# Patient Record
Sex: Male | Born: 1980 | Race: Black or African American | Hispanic: No | Marital: Married | Smoking: Current every day smoker
Health system: Southern US, Community
[De-identification: ages and names within clinical notes are randomized; demographics above are authoritative.]

---

## 2004-09-30 ENCOUNTER — Emergency Department (HOSPITAL_COMMUNITY): Admission: EM | Admit: 2004-09-30 | Discharge: 2004-09-30 | Payer: Self-pay | Admitting: Emergency Medicine

## 2013-09-04 ENCOUNTER — Emergency Department (HOSPITAL_COMMUNITY)
Admission: EM | Admit: 2013-09-04 | Discharge: 2013-09-04 | Disposition: A | Discharge status: Home / Self Care | Payer: Self-pay | Attending: Emergency Medicine | Admitting: Emergency Medicine

## 2013-09-04 ENCOUNTER — Emergency Department (HOSPITAL_COMMUNITY): Payer: Self-pay

## 2013-09-04 ENCOUNTER — Encounter (HOSPITAL_COMMUNITY): Payer: Self-pay | Admitting: Emergency Medicine

## 2013-09-04 DIAGNOSIS — S62319A Displaced fracture of base of unspecified metacarpal bone, initial encounter for closed fracture: Secondary | ICD-10-CM | POA: Insufficient documentation

## 2013-09-04 DIAGNOSIS — Y9367 Activity, basketball: Secondary | ICD-10-CM | POA: Insufficient documentation

## 2013-09-04 DIAGNOSIS — S62306A Unspecified fracture of fifth metacarpal bone, right hand, initial encounter for closed fracture: Secondary | ICD-10-CM

## 2013-09-04 DIAGNOSIS — Y9239 Other specified sports and athletic area as the place of occurrence of the external cause: Secondary | ICD-10-CM | POA: Insufficient documentation

## 2013-09-04 DIAGNOSIS — W219XXA Striking against or struck by unspecified sports equipment, initial encounter: Secondary | ICD-10-CM | POA: Insufficient documentation

## 2013-09-04 DIAGNOSIS — Y92838 Other recreation area as the place of occurrence of the external cause: Secondary | ICD-10-CM

## 2013-09-04 DIAGNOSIS — F17200 Nicotine dependence, unspecified, uncomplicated: Secondary | ICD-10-CM | POA: Insufficient documentation

## 2013-09-04 MED ORDER — HYDROCODONE-ACETAMINOPHEN 5-325 MG PO TABS: 1.0000 | ORAL_TABLET | ORAL | Status: AC | PRN

## 2013-09-04 MED ORDER — IBUPROFEN 400 MG PO TABS
800.0000 mg | ORAL_TABLET | Freq: Once | ORAL | Status: AC
  Administered 2013-09-04: 800 mg via ORAL
  Filled 2013-09-04: qty 2

## 2013-09-04 NOTE — Progress Notes (Signed)
Orthopedic Tech Progress Note Patient Details:  Jason Moon [birth year] [id number] Ulna gutter splint applied to RUE; application tolerated well. Arm sling applied. Ortho Devices Type of Ortho Device: Ace wrap;Arm sling;Ulna gutter splint Ortho Device/Splint Location: RUE Ortho Device/Splint Interventions: Application   Asia R Thompson 09/04/2013, 2:09 PM

## 2013-09-04 NOTE — ED Notes (Signed)
Patient states he hurt his R wrist playing basketball on Saturday.   Patient states "I think it's fractured".   Patient states has not been using anything for pain at home.   Patient has been icing the area since time of injury.

## 2013-09-04 NOTE — Discharge Instructions (Signed)
Take Vicodin for severe pain only. No driving or operating heavy machinery while taking vicodin. This medication may cause drowsiness.  Boxer's Fracture You have a break (fracture) of the fifth metacarpal bone. This is commonly called a boxer's fracture. This is the bone in the hand where the little finger attaches. The fracture is in the end of that bone, closest to the little finger. It is usually caused when you hit an object with a clenched fist. Often, the knuckle is pushed down by the impact. Sometimes, the fracture rotates out of position. A boxer's fracture will usually heal within 6 weeks, if it is treated properly and protected from re-injury. Surgery is sometimes needed. A cast, splint, or bulky hand dressing may be used to protect and immobilize a boxer's fracture. Do not remove this device or dressing until your caregiver approves. Keep your hand elevated, and apply ice packs for 15-20 minutes every 2 hours, for the first 2 days. Elevation and ice help reduce swelling and relieve pain. See your caregiver, or an orthopedic specialist, for follow-up care within the next 10 days. This is to make sure your fracture is healing properly. Document Released: 03/23/2005 Document Revised: 06/15/2011 Document Reviewed: 09/10/2006 [facility] Patient Information 2014 [place], [place].  Hand Fracture, Metacarpals Fractures of metacarpals are breaks in the bones of the hand. They extend from the knuckles to the wrist. These bones can undergo many types of fractures. There are different ways of treating these fractures, all of which may be correct. TREATMENT  Hand fractures can be treated with:   Non-reduction - The fracture is casted without changing the positions of the fracture (bone pieces) involved. This fracture is usually left in a cast for 4 to 6 weeks or as your caregiver thinks necessary.  Closed reduction - The bones are moved back into position without surgery and then casted.  ORIF (open  reduction and internal fixation) - The fracture site is opened and the bone pieces are fixed into place with some type of hardware, such as screws, etc. They are then casted. Your caregiver will discuss the type of fracture you have and the treatment that should be best for that problem. If surgery is chosen, let your caregivers know about the following.  LET YOUR CAREGIVERS KNOW ABOUT:  Allergies.  Medications you are taking, including herbs, eye drops, over the counter medications, and creams.  Use of steroids (by mouth or creams).  Previous problems with anesthetics or novocaine.  Possibility of pregnancy.  History of blood clots (thrombophlebitis).  History of bleeding or blood problems.  Previous surgeries.  Other health problems. AFTER THE PROCEDURE After surgery, you will be taken to the recovery area where a nurse will watch and check your progress. Once you are awake, stable, and taking fluids well, barring other problems, you'll be allowed to go home. Once home, an ice pack applied to your operative site may help with pain and keep the swelling down. HOME CARE INSTRUCTIONS   Follow your caregiver's instructions as to activities, exercises, physical therapy, and driving a car.  Daily exercise is helpful for keeping range of motion and strength. Exercise as instructed.  To lessen swelling, keep the injured hand elevated above the level of your heart as much as possible.  Apply ice to the injury for 15-20 minutes each hour while awake for the first 2 days. Put the ice in a plastic bag and place a thin towel between the bag of ice and your cast.  Move the  fingers of your casted hand several times a day.  If a plaster or fiberglass cast was applied:  Do not try to scratch the skin under the cast using a sharp or pointed object.  Check the skin around the cast every day. You may put lotion on red or sore areas.  Keep your cast dry. Your cast can be protected during  bathing with a plastic bag. Do not put your cast into the water.  If a plaster splint was applied:  Wear your splint for as long as directed by your caregiver or until seen again.  Do not get your splint wet. Protect it during bathing with a plastic bag.  You may loosen the elastic bandage around the splint if your fingers start to get numb, tingle, get cold or turn blue.  Do not put pressure on your cast or splint; this may cause it to break. Especially, do not lean plaster casts on hard surfaces for 24 hours after application.  Take medications as directed by your caregiver.  Only take over-the-counter or prescription medicines for pain, discomfort, or fever as directed by your caregiver.  Follow-up as provided by your caregiver. This is very important in order to avoid permanent injury or disability and chronic pain. SEEK MEDICAL CARE IF:   Increased bleeding (more than a small spot) from beneath your cast or splint if there is beneath the cast as with an open reduction.  Redness, swelling, or increasing pain in the wound or from beneath your cast or splint.  Pus coming from wound or from beneath your cast or splint.  An unexplained oral temperature above 102 F (38.9 C) develops, or as your caregiver suggests.  A foul smell coming from the wound or dressing or from beneath your cast or splint.  You have a problem moving any of your fingers. SEEK IMMEDIATE MEDICAL CARE IF:   You develop a rash  You have difficulty breathing  You have any allergy problems If you do not have a window in your cast for observing the wound, a discharge or minor bleeding may show up as a stain on the outside of your cast. Report these findings to your caregiver. MAKE SURE YOU:   Understand these instructions.  Will watch your condition.  Will get help right away if you are not doing well or get worse. Document Released: 03/23/2005 Document Revised: 06/15/2011 Document Reviewed:  11/10/2007 [facility] Patient Information 2014 [place], [place].

## 2013-09-04 NOTE — ED Notes (Signed)
Called ortho to place ulnar gutter splint.

## 2013-09-04 NOTE — ED Provider Notes (Signed)
Medical screening examination/treatment/procedure(s) were performed by non-physician practitioner and as supervising physician I was immediately available for consultation/collaboration.   EKG Interpretation None       Doug Sou, MD 09/04/13 1707

## 2013-09-04 NOTE — ED Provider Notes (Signed)
CSN: [id number]     Arrival date & time 09/04/13  1229 History  This chart was scribed for non-physician practitioner, Johnnette Gourd, PA-C working with Doug Sou, MD by Greggory Stallion, ED scribe. This patient was seen in room TR05C/TR05C and the patient's care was started at 12:54 PM.   Chief Complaint  Patient presents with  . Wrist Pain   The history is provided by the patient. No language interpreter was used.   HPI Comments: Jason Moon is a 33 y.o. male who presents to the Emergency Department complaining of right wrist injury that occurred 2 days ago. Pt was playing a basketball and bent his wrist forward while trying to get the ball. He has sudden onset, worsening right wrist pain with associated swelling. Movements worsen the pain. He has used ice with no relief.   History reviewed. No pertinent past medical history. History reviewed. No pertinent past surgical history. No family history on file. History  Substance Use Topics  . Smoking status: Current Every Day Smoker -- 0.50 packs/day    Types: Cigarettes  . Smokeless tobacco: Not on file  . Alcohol Use: Yes    Review of Systems  Musculoskeletal: Positive for arthralgias and joint swelling.  All other systems reviewed and are negative.  Allergies  Review of patient's allergies indicates no known allergies.  Home Medications   Prior to Admission medications   Medication Sig Start Date End Date Taking? Authorizing Provider  HYDROcodone-acetaminophen (NORCO/VICODIN) 5-325 MG per tablet Take 1-2 tablets by mouth every 4 (four) hours as needed. 09/04/13   Trevor Mace, PA-C   BP 113/76  Pulse 85  Temp(Src) 98.4 F (36.9 C) (Oral)  Resp 18  Ht 5\' 6"  (1.676 m)  Wt 156 lb (70.761 kg)  BMI 25.19 kg/m2  SpO2 98%  Physical Exam  Nursing note and vitals reviewed. Constitutional: He is oriented to person, place, and time. He appears well-developed and well-nourished. No distress.  HENT:  Head: Normocephalic and  atraumatic.  Eyes: Conjunctivae and EOM are normal.  Neck: Normal range of motion. Neck supple.  Cardiovascular: Normal rate, regular rhythm and normal heart sounds.   Pulmonary/Chest: Effort normal and breath sounds normal.  Musculoskeletal: Normal range of motion. He exhibits no edema.  Tender to palpation over second, third and fourth metacarpals with mild swelling. Full wrist ROM, pain with flexion. Full hand ROM. No deformity. Capillary refill less than 3 seconds. 2+ radial pulse.   Neurological: He is alert and oriented to person, place, and time.  Skin: Skin is warm and dry.  Psychiatric: He has a normal mood and affect. His behavior is normal.    ED Course  Procedures (including critical care time)  DIAGNOSTIC STUDIES: Oxygen Saturation is 98% on RA, normal by my interpretation.    COORDINATION OF CARE: 12:55 PM-Discussed treatment plan which includes xray with pt at bedside and pt agreed to plan.   Labs Review Labs Reviewed - No data to display  Imaging Review Dg Wrist Complete Right  09/04/2013   CLINICAL DATA:  Pain post trauma  EXAM: RIGHT WRIST - COMPLETE 3+ VIEW  COMPARISON:  None.  FINDINGS: Frontal, oblique, lateral, and ulnar deviation scaphoid images were obtained. There is a comminuted fracture of the distal fifth metacarpal with volar angulation distally. There is no other fracture. No dislocation. Joint spaces appear intact.  IMPRESSION: Comminuted fracture distal fifth metacarpal with volar angulation distally.   Electronically Signed   By: Bretta Bang M.D.  On: 09/04/2013 13:28   Dg Hand Complete Right  09/04/2013   CLINICAL DATA:  Twisting injury with pain.  EXAM: RIGHT HAND - COMPLETE 3+ VIEW  COMPARISON:  None.  FINDINGS: There is a fracture of the fifth metacarpal neck with minimal apex dorsal angulation. No additional evidence of acute fracture.  IMPRESSION: Fifth metacarpal neck fracture.   Electronically Signed   By: Leanna BattlesMelinda  Blietz M.D.   On:  09/04/2013 13:23     EKG Interpretation None      MDM   Final diagnoses:  Fracture of fifth metacarpal bone of right hand    Patient presenting with right hand injury. Well appearing and in no apparent distress. Afebrile, vital signs stable. Neurovascularly intact. X-ray showing comminuted fracture distal fifth metacarpal with volar angulation distally. Ulnar gutter splint applied, followup with orthopedics. Stable for discharge. Return precautions given. Patient states understanding of treatment care plan and is agreeable.  I personally performed the services described in this documentation, which was scribed in my presence. The recorded information has been reviewed and is accurate.  Trevor MaceRobyn M Albert, PA-C 09/04/13 1416

## 2014-11-05 IMAGING — CR DG HAND COMPLETE 3+V*R*
3 series · 3 of 3 positions shown · non-contrast
Comparison: None.

CLINICAL DATA: Twisting injury with pain.

EXAM:
RIGHT HAND - COMPLETE 3+ VIEW

[x hand pa right]
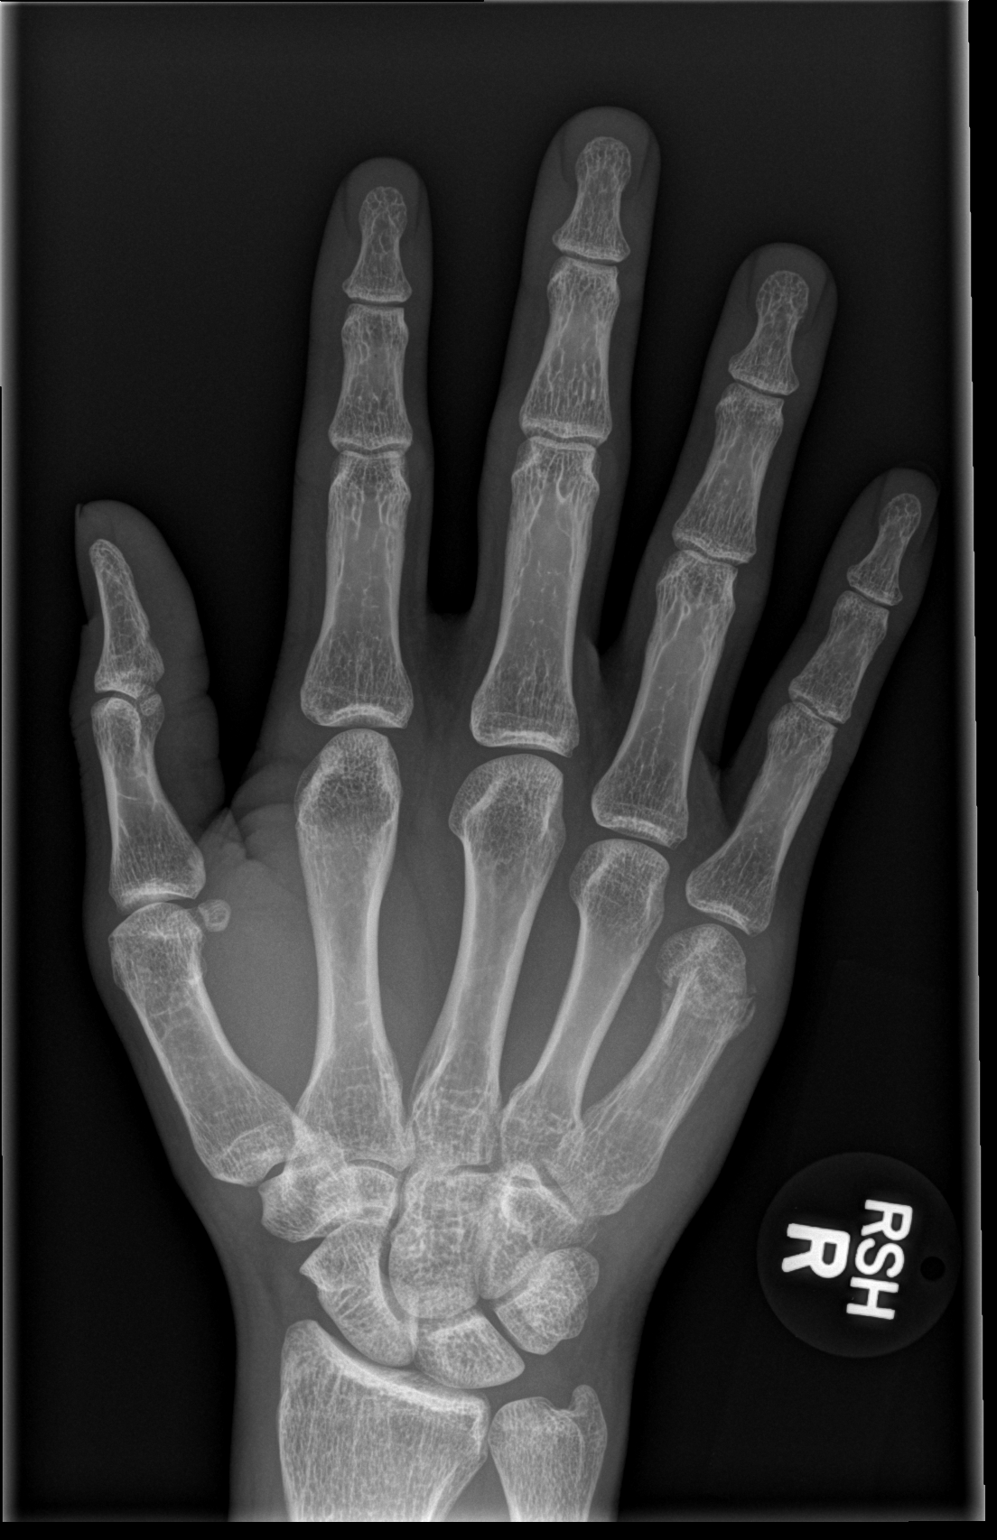

[x hand obl right]
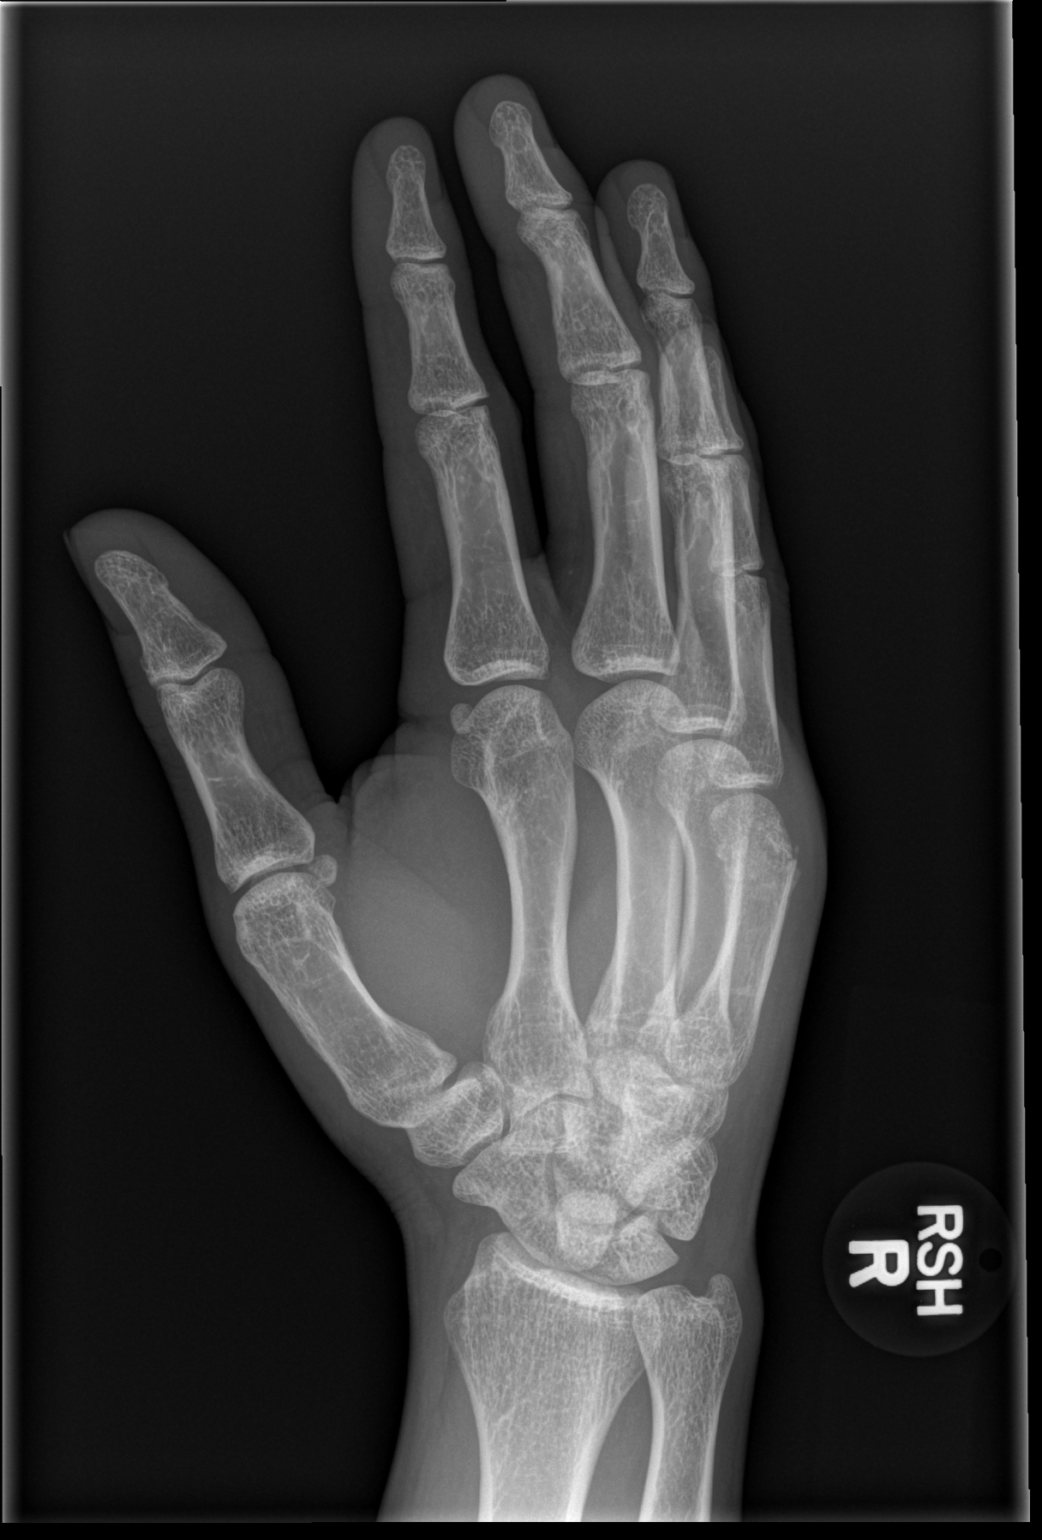

[x hand lat right]
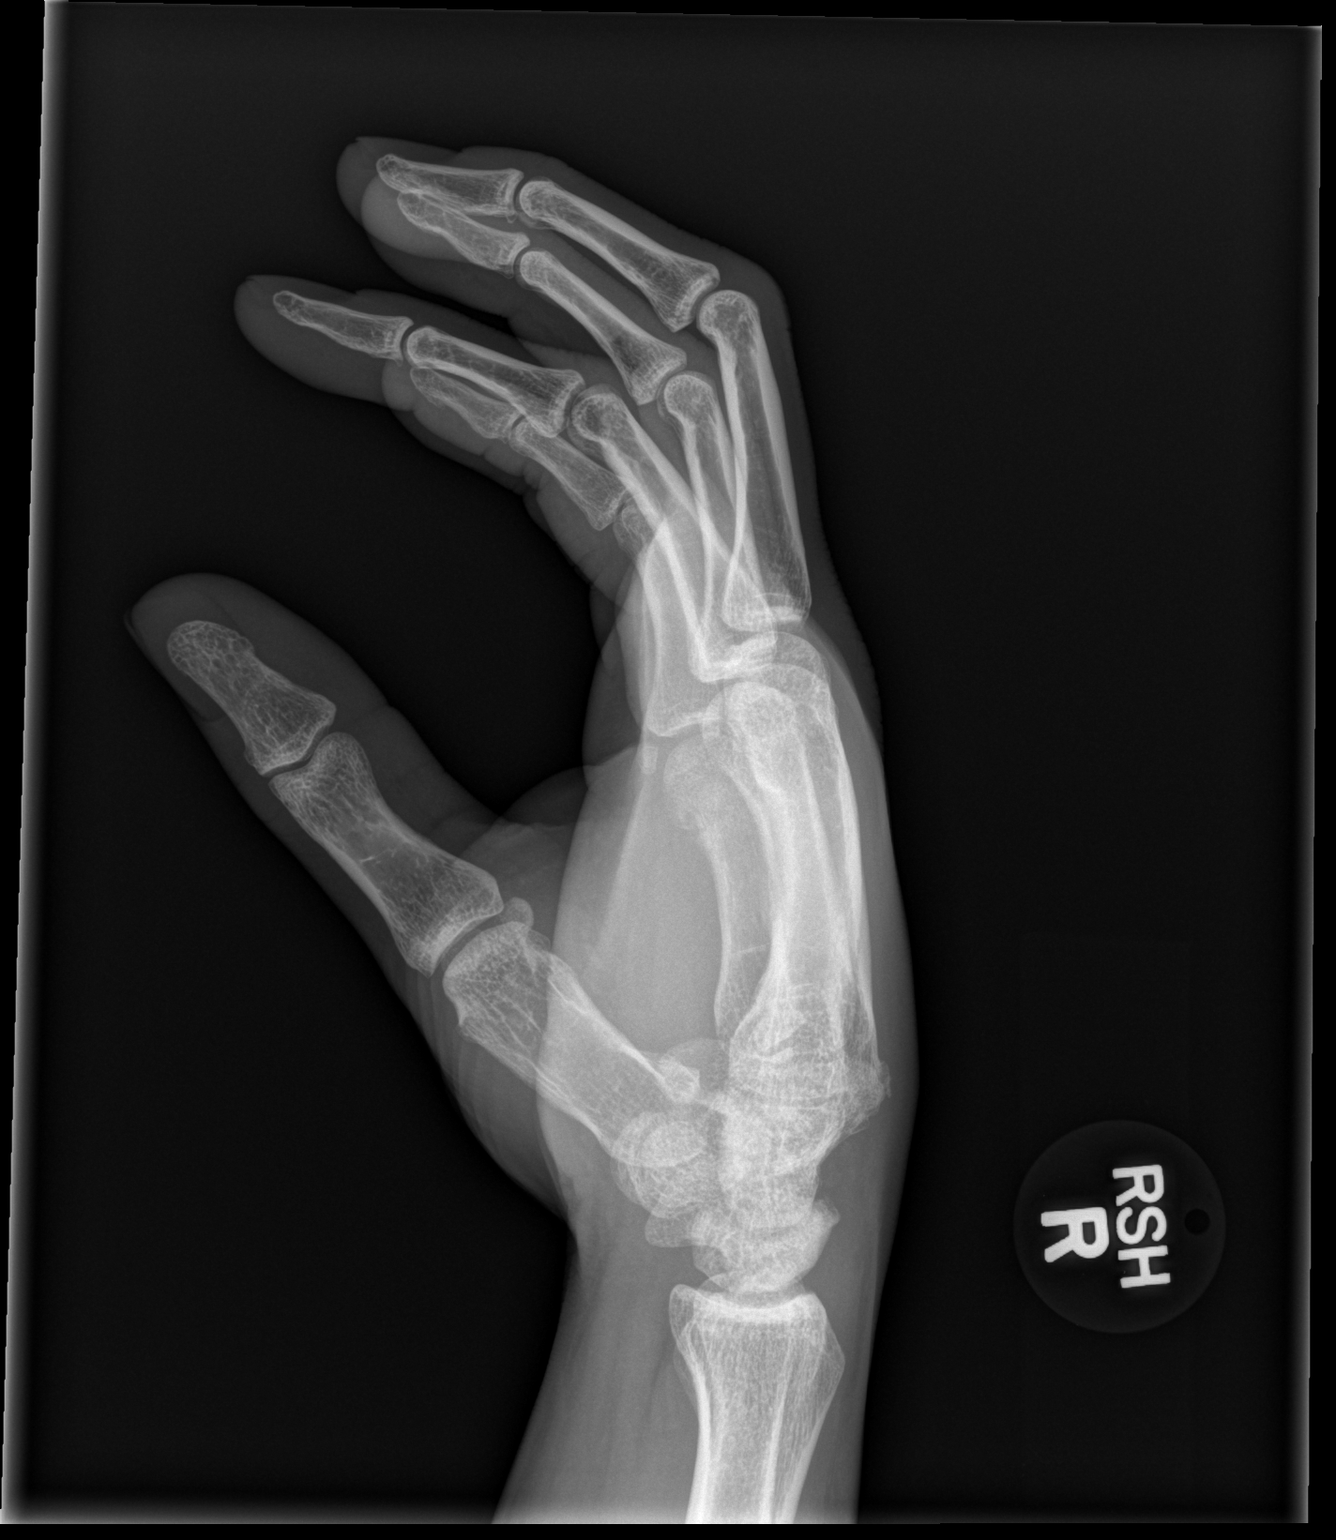

[3 of 3 positions shown; findings below may reference images not displayed]

FINDINGS: There is a fracture of the fifth metacarpal neck with minimal apex
dorsal angulation. No additional evidence of acute fracture.
IMPRESSION: Fifth metacarpal neck fracture.

## 2015-06-23 ENCOUNTER — Emergency Department (HOSPITAL_COMMUNITY)
Admission: EM | Admit: 2015-06-23 | Discharge: 2015-06-23 | Disposition: A | Discharge status: Home / Self Care | Payer: Self-pay | Attending: Emergency Medicine | Admitting: Emergency Medicine

## 2015-06-23 ENCOUNTER — Encounter (HOSPITAL_COMMUNITY): Payer: Self-pay | Admitting: *Deleted

## 2015-06-23 DIAGNOSIS — K0889 Other specified disorders of teeth and supporting structures: Secondary | ICD-10-CM | POA: Insufficient documentation

## 2015-06-23 DIAGNOSIS — F17210 Nicotine dependence, cigarettes, uncomplicated: Secondary | ICD-10-CM | POA: Insufficient documentation

## 2015-06-23 DIAGNOSIS — K029 Dental caries, unspecified: Secondary | ICD-10-CM

## 2015-06-23 MED ORDER — NAPROXEN 500 MG PO TABS: 500.0000 mg | ORAL_TABLET | Freq: Two times a day (BID) | ORAL | Status: AC

## 2015-06-23 MED ORDER — ACETAMINOPHEN 325 MG PO TABS
650.0000 mg | ORAL_TABLET | Freq: Once | ORAL | Status: AC
  Administered 2015-06-23: 650 mg via ORAL
  Filled 2015-06-23: qty 2

## 2015-06-23 MED ORDER — AMOXICILLIN-POT CLAVULANATE 875-125 MG PO TABS: 1.0000 | ORAL_TABLET | Freq: Two times a day (BID) | ORAL | Status: AC

## 2015-06-23 MED ORDER — KETOROLAC TROMETHAMINE 30 MG/ML IJ SOLN
30.0000 mg | Freq: Once | INTRAMUSCULAR | Status: AC
  Administered 2015-06-23: 30 mg via INTRAMUSCULAR
  Filled 2015-06-23: qty 1

## 2015-06-23 NOTE — ED Notes (Signed)
Declined W/C at D/C and was escorted to lobby by RN. 

## 2015-06-23 NOTE — ED Notes (Signed)
Pt reports dental pain started on Thursday. Pt reports 2 chipped teeth.

## 2015-06-23 NOTE — Discharge Instructions (Signed)
Community Resource Guide Dental °The [employer]’s “211” is a great source of information about community services available.  Access by dialing 2-1-1 from anywhere in [place], or by website -  www.nc211.org.  ° °Other Local Resources (Updated 04/2015) ° °Dental  Care °  °Services ° °  °Phone Number and Address  °Cost  °[facility] For children 0 - 33 years of age:  °• Cleaning °• Tooth brushing/flossing instruction °• Sealants, fillings, crowns °• Extractions °• Emergency treatment  [phone] °[address] °[place], [place] [address] Charges based on family income.  Medicaid and some insurance plans accepted.   °  °[facility] [place] • Cleaning °• Sealants, fillings, crowns °• Extractions °• Emergency treatment [phone] °[address] °[place], [place] ° Pregnant women 18 years of age or older with a Medicaid card  °[facility] [place] • Cleaning °• Sealants, fillings, crowns °• Extractions °• Emergency treatment [phone] °[address] °[place], [place] Pregnant women 18 years of age or older with a Medicaid card  °[facility] For children 0 - 33 years of age:  °• Cleaning °• Tooth brushing/flossing instruction °• Sealants, fillings, crowns °• Extractions °• Emergency treatment °Limited orthodontic services for patients with Medicaid [phone] °[address] °, [place] [address] Medicaid and [place] Health Choice cover for children up to age 33 and pregnant women.  Parents of children up to age 33 without Medicaid pay a reduced fee at time of service.  °[facility] [place] For children 0 - 33 years of age:  °• Cleaning °• Tooth brushing/flossing instruction °• Sealants, fillings, crowns °• Extractions °• Emergency treatment °Limited orthodontic services for patients with Medicaid  [phone] °[address] °[place], [place].  Medicaid and [place] Health Choice cover for children up to age 33 and pregnant women.  Parents of children up to age 33 without Medicaid pay a reduced fee.  °[facility] • Cleaning °• Sealants, fillings, crowns °• Extractions ° °Hours: Tuesdays and Thursdays, 4:15 - 8 pm [phone] °[address] °[place], [place] [address] Services free of charge to [place] residents ages 18-64 who do not have health insurance, Medicare, Medicaid, or VA benefits and fall within federal poverty guidelines  °[facility] ° ° ° Provides dental care in addition to primary medical care, nutritional counseling, and pharmacy: °• Cleaning °• Sealants, fillings, crowns °• Extractions ° ° ° ° ° ° ° ° ° ° ° ° ° ° ° ° ° [phone] °[facility], [address] °[place], [place] ° °[phone] °[facility], [address] [place], [place] ° °[phone] °[facility] °[place], [place] ° °[phone] °[facility], [address] °[place], [place] ° °[phone] °[facility] °[address] °[place], [place] Accepts Medicaid, Medicare, most insurance.  Also provides services available to all with fees adjusted based on ability to pay.    °[facility] • Cleaning °• Tooth brushing/flossing instruction °• Sealants, fillings, crowns °• Extractions °• Emergency treatment °Hours: Tuesdays, Thursdays, and Fridays from 8 am to 5 pm by appointment only. [phone] °[address] °[place],  [address] [place] residents with Medicaid (depending on eligibility) and children with  Health Choice - call for more information.  °  [facility] • Extractions only ° °Hours: 2nd and 4th Thursday of each month from 6:30 am - 9 am.   [phone] ext. [phone] °[address] °[place], [place] [address] Ages 18 and older only.  Patients are seen on a first come, first served basis.  °[facility] • Cleanings °• Fillings °• Extractions °• Orthodontics °• Endodontics °• Implants/Crowns/Bridges °• Complete and partial dentures [phone] °[place], [place] Patients must complete an application for services.  There is often a waiting list.   ° °

## 2015-06-23 NOTE — ED Provider Notes (Signed)
CSN: [id number]     Arrival date & time 06/23/15  0750 History   First MD Initiated Contact with Patient 06/23/15 [phone]     Chief Complaint  Patient presents with  . Dental Pain    HPI   Jason Moon is an 35 y.o. male with no significant PMH who presents to the ED for evaluation of dental pain. He states he has had intermittent issues with dental pain since 02/2015. However, for the past three days has noticed pain significantly increased from baseline along his entire lower left quadrant. States he knows he has a couple teeth that need root canals/extraction but he has not been to a dentist to have the work done. He states he also has two chipped teeth on his right lower quadrant. Denies drainage or bleeding. Denies fever, chills, trismus, dysphagia. He has tried tylenol with no relief of pain.  History reviewed. No pertinent past medical history. History reviewed. No pertinent past surgical history. History reviewed. No pertinent family history. Social History  Substance Use Topics  . Smoking status: Current Every Day Smoker -- 0.50 packs/day    Types: Cigarettes  . Smokeless tobacco: None  . Alcohol Use: Yes    Review of Systems  All other systems reviewed and are negative.     Allergies  Review of patient's allergies indicates no known allergies.  Home Medications   Prior to Admission medications   Medication Sig Start Date End Date Taking? Authorizing Provider  HYDROcodone-acetaminophen (NORCO/VICODIN) 5-325 MG per tablet Take 1-2 tablets by mouth every 4 (four) hours as needed. 09/04/13   Robyn M Hess, PA-C   BP 138/66 mmHg  Pulse 72  Temp(Src) 98.7 F (37.1 C) (Oral)  Resp 18  Ht  (1.702 m)  Wt 70.308 kg  BMI 24.27 kg/m2  SpO2 98% Physical Exam  Constitutional: He is oriented to person, place, and time. No distress.  HENT:  Head: Atraumatic.  Right Ear: External ear normal.  Left Ear: External ear normal.  Nose: Nose normal.  Generally poor dentition.  LLQ with much crowding, no grossly visible abscess or broken teeth. Jason Moon has a few grossly carious teeth, again with no abscess visible. Uvula midline  Left mandibular area diffusely ttp with no edema or erythema  No cervical LAD. No submental or submandibular swelling. No trismus.   Eyes: Conjunctivae are normal. No scleral icterus.  Neck: Normal range of motion. Neck supple.  Cardiovascular: Normal rate and regular rhythm.   Pulmonary/Chest: Effort normal. No respiratory distress. He exhibits no tenderness.  Abdominal: Soft. He exhibits no distension. There is no tenderness.  Neurological: He is alert and oriented to person, place, and time.  Skin: Skin is warm and dry. He is not diaphoretic.  Psychiatric: He has a normal mood and affect. His behavior is normal.  Nursing note and vitals reviewed.   ED Course  Procedures (including critical care time) Labs Review Labs Reviewed - No data to display  Imaging Review No results found. I have personally reviewed and evaluated these images and lab results as part of my medical decision-making.   EKG Interpretation None      MDM   Final diagnoses:  Pain due to dental caries    I discussed with pt that ultimately he will need to see a dentist for definitive treatment as he likely needs several fillings, root canals, and/or extractions. Discussed with pt that I will start him on a course of antibiotics and give rx for naproxen for  pain. Pt declining toradol here. Dental resource guide given as well. At this time pt is afebrile, and has no tachypnea or increased WOB. He is nontoxic appearing. Stable for discharge. He is in agreement with plan. ER return precautions given.    Carlene CoriaSerena Y Bhavin Monjaraz, PA-C 06/23/15 1035  Glynn OctaveStephen Rancour, MD 06/23/15 [phone]

## 2020-12-10 ENCOUNTER — Encounter (HOSPITAL_COMMUNITY): Payer: Self-pay | Admitting: Emergency Medicine

## 2020-12-10 ENCOUNTER — Emergency Department (HOSPITAL_COMMUNITY)
Admission: EM | Admit: 2020-12-10 | Discharge: 2020-12-10 | Disposition: A | Discharge status: Home / Self Care | Payer: Self-pay | Attending: Emergency Medicine | Admitting: Emergency Medicine

## 2020-12-10 DIAGNOSIS — F17210 Nicotine dependence, cigarettes, uncomplicated: Secondary | ICD-10-CM | POA: Insufficient documentation

## 2020-12-10 DIAGNOSIS — N5089 Other specified disorders of the male genital organs: Secondary | ICD-10-CM | POA: Insufficient documentation

## 2020-12-10 LAB — CBC WITH DIFFERENTIAL/PLATELET
Abs Immature Granulocytes: 0 10*3/uL (ref 0.00–0.07)
Basophils Absolute: 0 10*3/uL (ref 0.0–0.1)
Basophils Relative: 0 %
Eosinophils Absolute: 0.2 10*3/uL (ref 0.0–0.5)
Eosinophils Relative: 2 %
HCT: 45.3 % (ref 39.0–52.0)
Hemoglobin: 15.5 g/dL (ref 13.0–17.0)
Lymphocytes Relative: 30 %
Lymphs Abs: 2.5 10*3/uL (ref 0.7–4.0)
MCH: 33.4 pg (ref 26.0–34.0)
MCHC: 34.2 g/dL (ref 30.0–36.0)
MCV: 97.6 fL (ref 80.0–100.0)
Monocytes Absolute: 0.7 10*3/uL (ref 0.1–1.0)
Monocytes Relative: 9 %
Neutro Abs: 4.8 10*3/uL (ref 1.7–7.7)
Neutrophils Relative %: 59 %
Platelets: 137 10*3/uL — ABNORMAL LOW (ref 150–400)
RBC: 4.64 MIL/uL (ref 4.22–5.81)
RDW: 11.8 % (ref 11.5–15.5)
WBC: 8.2 10*3/uL (ref 4.0–10.5)
nRBC: 0 % (ref 0.0–0.2)
nRBC: 0 /100 WBC

## 2020-12-10 LAB — URINALYSIS, ROUTINE W REFLEX MICROSCOPIC
Glucose, UA: NEGATIVE mg/dL
Hgb urine dipstick: NEGATIVE
Ketones, ur: NEGATIVE mg/dL
Leukocytes,Ua: NEGATIVE
Nitrite: NEGATIVE
Protein, ur: NEGATIVE mg/dL
Specific Gravity, Urine: >1.03 — ABNORMAL HIGH (ref 1.005–1.030)
pH: 5.5 (ref 5.0–8.0)

## 2020-12-10 LAB — BASIC METABOLIC PANEL
Anion gap: 8 (ref 5–15)
BUN: 10 mg/dL (ref 6–20)
CO2: 27 mmol/L (ref 22–32)
Calcium: 9 mg/dL (ref 8.9–10.3)
Chloride: 102 mmol/L (ref 98–111)
Creatinine, Ser: 1.16 mg/dL (ref 0.61–1.24)
GFR, Estimated: >60 mL/min (ref >60)
Glucose, Bld: 108 mg/dL — ABNORMAL HIGH (ref 70–99)
Potassium: 4.1 mmol/L (ref 3.5–5.1)
Sodium: 137 mmol/L (ref 135–145)

## 2020-12-10 LAB — HIV ANTIBODY (ROUTINE TESTING W REFLEX): HIV Screen 4th Generation wRfx: NONREACTIVE

## 2020-12-10 NOTE — ED Provider Notes (Signed)
[facility] EMERGENCY DEPARTMENT Provider Note   CSN: [id number] Arrival date & time: 12/10/20  1159     History Chief Complaint  Patient presents with   Groin Pain    Jason Moon is a 40 y.o. male who presents to the ED today with complaint of rash to groin area for the past few days.  Patient reports he first noticed a genital sore Thursday 09/01.  Did not think much of it and had unprotected intercourse with a male the next day.  Patient states that since that time he has noticed some swelling to his right groin area as well as spreading of the sores.  He denies any pain to them.  He states they are mildly itchy.  He denies any penile discharge or testicular pain.  He is adamant that he only has intercourse with females.  He does mention that he had intercourse with a male about 1 to 2 weeks ago that had similar sores however he did not think anything of it at that time.  He denies any fevers, chills, headache, URI-like symptoms prior to noticing a rash.  The history is provided by the patient and medical records.      History reviewed. No pertinent past medical history.  There are no problems to display for this patient.   No past surgical history on file.     No family history on file.  Social History   Tobacco Use   Smoking status: Every Day    Packs/day: 0.50    Types: Cigarettes  Substance Use Topics   Alcohol use: Yes   Drug use: No    Home Medications Prior to Admission medications   Medication Sig Start Date End Date Taking? Authorizing Provider  amoxicillin-clavulanate (AUGMENTIN) 875-125 MG tablet Take 1 tablet by mouth every 12 (twelve) hours. Patient not taking: Reported on 12/10/2020 06/23/15   Eliseo Squires, PA-C  HYDROcodone-acetaminophen (NORCO/VICODIN) 5-325 MG per tablet Take 1-2 tablets by mouth every 4 (four) hours as needed. Patient not taking: Reported on 12/10/2020 09/04/13   Kathrynn Speed, PA-C  naproxen (NAPROSYN) 500  MG tablet Take 1 tablet (500 mg total) by mouth 2 (two) times daily. Patient not taking: Reported on 12/10/2020 06/23/15   Eliseo Squires, PA-C    Allergies    Patient has no known allergies.  Review of Systems   Review of Systems  Constitutional:  Negative for chills and fever.  Genitourinary:  Positive for genital sores. Negative for penile discharge, penile pain, scrotal swelling and testicular pain.  All other systems reviewed and are negative.  Physical Exam Updated Vital Signs BP 113/88   Pulse 77   Temp 98.8 F (37.1 C) (Oral)   Resp 16   SpO2 99%   Physical Exam Vitals and nursing note reviewed.  Constitutional:      Appearance: He is not ill-appearing.  HENT:     Head: Normocephalic and atraumatic.  Eyes:     Conjunctiva/sclera: Conjunctivae normal.  Cardiovascular:     Rate and Rhythm: Normal rate and regular rhythm.  Pulmonary:     Effort: Pulmonary effort is normal.     Breath sounds: Normal breath sounds.  Abdominal:     Palpations: Abdomen is soft.     Tenderness: There is no abdominal tenderness.  Genitourinary:    Comments: Chaperone present for exam Zoe Cagle RN Multiple umbilicated sores to groin without discharge or TTP. + Right inguinal lymphadenopathy. Penis without phimosis/paraphimosis, hypospadias, erythema,  tenderness, or discharge. Testes with no masses or tenderness, no swelling, and cremasterics reflex present bilaterally. No abnormal lie. Musculoskeletal:     Cervical back: Neck supple.  Skin:    General: Skin is warm and dry.  Neurological:     Mental Status: He is alert.    ED Results / Procedures / Treatments   Labs (all labs ordered are listed, but only abnormal results are displayed) Labs Reviewed  URINALYSIS, ROUTINE W REFLEX MICROSCOPIC - Abnormal; Notable for the following components:      Result Value   Specific Gravity, Urine >1.030 (*)    Bilirubin Urine SMALL (*)    All other components within normal limits  BASIC  METABOLIC PANEL - Abnormal; Notable for the following components:   Glucose, Bld 108 (*)    All other components within normal limits  CBC WITH DIFFERENTIAL/PLATELET - Abnormal; Notable for the following components:   Platelets 137 (*)    All other components within normal limits  URINE CULTURE  MONKEYPOX VIRUS DNA, QUALITATIVE REAL-TIME PCR  HIV ANTIBODY (ROUTINE TESTING W REFLEX)  RPR  HIV-1/HIV-2 QUAL RNA  HERPES SIMPLEX VIRUS(HSV) DNA BY PCR  GC/CHLAMYDIA PROBE AMP ([place]) NOT AT [facility]    EKG None  Radiology No results found.  Procedures Procedures   Medications Ordered in ED Medications - No data to display  ED Course  I have reviewed the triage vital signs and the nursing notes.  Pertinent labs & imaging results that were available during my care of the patient were reviewed by me and considered in my medical decision making (see chart for details).    MDM Rules/Calculators/A&P                           40 year old male presents to the ED today for genital sores for the past few days.  On arrival to the ED vitals are stable patient appears to be no acute distress.  He was medically screened in the triage area and work-up started including HIV, syphilis, GC chlamydia, urinalysis.  But HIV has come back negative.  Pending urinalysis at this time.  Chaperone present for GU exam; has multiple umbilicated lesions to his groin with right inguinal lymphadenopathy.  Rash does not seem consistent with HSV or syphilis at this time.  No penis involvement.  No penile discharge.  No testicular pain/swelling.  There is concern for possible monkey box at this time however patient is adamant that he only has intercourse with females despite having asked several times.  He does report he was recently released from prison in March however I do not feel this would contribute to his symptoms only starting now.  He does mention that he had intercourse with a male with similar lesions  about 1 to 2 weeks ago, anally.  Have discussed case with infectious disease Dr. Algis Liming who recommends HIV RNA, HSV, monkey box.  He recommends having patient follow-up in the infectious disease clinic for possible treatment. Pt stable for discharge after labs collected.   This note was prepared using Dragon voice recognition software and may include unintentional dictation errors due to the inherent limitations of voice recognition software.   Final Clinical Impression(s) / ED Diagnoses Final diagnoses:  Genital lesion, male    Rx / DC Orders ED Discharge Orders     None        Discharge Instructions      It is recommended that you  self isolate pending your monkeypox test. If your test returns positive per CDC guidelines a 21 day self isolation period is recommended as this is usually the time it takes for the rash to fully resolve. You are infectious as long as your rash is still present.   Your test should be back by the end of the week. We have provided you with a worknote through the weekend pending your results.   The Infectious Disease clinic will be contacting you if you test positive for monkeypox to discuss treatment options. Please await their call should your test come back positive.   We have also tested you for gonorrhea, chlamydia, HIV, herpes, and syphilis. Your tests should return in the next 2-3 days and you will receive a call if positive for any of these.   DO NOT HAVE INTERCOURSE until you have receive all of your test results/sought the appropriate treatment.   Return to the ED for any new/worsening symptoms.        Tanda Rockers, PA-C 12/10/20 2055    Melene Plan, DO 12/10/20 2230

## 2020-12-10 NOTE — ED Triage Notes (Signed)
Patient complains of painful bumps in pubic area. First noticed a single bump on Wednesday, then by Saturday there were several bumps.

## 2020-12-10 NOTE — Discharge Instructions (Addendum)
It is recommended that you self isolate pending your monkeypox test. If your test returns positive per CDC guidelines a 21 day self isolation period is recommended as this is usually the time it takes for the rash to fully resolve. You are infectious as long as your rash is still present.   Your test should be back by the end of the week. We have provided you with a worknote through the weekend pending your results.   The Infectious Disease clinic will be contacting you if you test positive for monkeypox to discuss treatment options. Please await their call should your test come back positive.   We have also tested you for gonorrhea, chlamydia, HIV, herpes, and syphilis. Your tests should return in the next 2-3 days and you will receive a call if positive for any of these.   DO NOT HAVE INTERCOURSE until you have receive all of your test results/sought the appropriate treatment.   Return to the ED for any new/worsening symptoms.

## 2020-12-10 NOTE — ED Provider Notes (Signed)
Emergency Medicine Provider Triage Evaluation Note  Jason Moon , a 40 y.o. male  was evaluated in triage.  Pt complains of dental sores and swelling to groin.  Patient reports that his genital sores started on Saturday night and have gotten progressively worse since then.  Patient reports sores to groin and penis.  Sores are painful.  No purulent discharge coming from sores.  Patient complains of swelling to right groin.  Patient is sexually active with multiple male partners.  Patient reports he was recently released from jail.  Review of Systems  Positive: Genital sores or lesions, swelling to groin and Negative: Penile discharge, dysuria, hematuria, urinary frequency, swelling or tenderness to scrotum  Physical Exam  BP 103/71 (BP Location: Left Arm)   Pulse 86   Temp 98.8 F (37.1 C) (Oral)   Resp 17   SpO2 94%  Gen:   Awake, no distress   Resp:  Normal effort  MSK:   Moves extremities without difficulty  Other:  Male RN present as chaperone.  Patient has multiple umbilicated sores to groin and shaft of penis.  Tenderness to right groin.  Medical Decision Making  Medically screening exam initiated at 1:18 PM.  Appropriate orders placed.  Kurtiss Nemetz was informed that the remainder of the evaluation will be completed by another provider, this initial triage assessment does not replace that evaluation, and the importance of remaining in the ED until their evaluation is complete.  The patient appears stable so that the remainder of the work up may be completed by another provider.      Haskel Schroeder, PA-C 12/10/20 1320    Mancel Bale, MD 12/10/20 [phone]

## 2020-12-10 NOTE — ED Notes (Signed)
Pt encouraged to provide a urine sample as soon as possible, Pt attempting to provide a sample at this time

## 2020-12-11 LAB — RPR: RPR Ser Ql: NONREACTIVE

## 2020-12-12 LAB — URINE CULTURE: Culture: NO GROWTH

## 2020-12-12 LAB — HIV-1/HIV-2 QUAL RNA
HIV-1 RNA, Qualitative: NONREACTIVE
HIV-2 RNA, Qualitative: NONREACTIVE

## 2020-12-14 ENCOUNTER — Telehealth: Payer: Self-pay | Admitting: Infectious Disease

## 2020-12-14 NOTE — Telephone Encounter (Signed)
Patient' monkey pox PCR was DC due to improper collection  We were supposed to followup and possibly treat him  Can we call him and bring him in for testing and treatment?

## 2020-12-16 NOTE — Telephone Encounter (Signed)
Patient is scheduled for 12/19/20. Patient advised of appointment info and address.

## 2020-12-19 ENCOUNTER — Encounter: Payer: Self-pay | Admitting: Physician Assistant

## 2020-12-20 ENCOUNTER — Encounter: Payer: Self-pay | Admitting: Physician Assistant
